# Patient Record
Sex: Male | Born: 2000 | Race: White | Hispanic: No | Marital: Single
Health system: Southern US, Community
[De-identification: ages and names within clinical notes are randomized; demographics above are authoritative.]

## PROBLEM LIST (undated history)

## (undated) DIAGNOSIS — S8982XA Other specified injuries of left lower leg, initial encounter: Secondary | ICD-10-CM

## (undated) HISTORY — PX: OTHER SURGICAL HISTORY: SHX169

---
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
## 2022-05-28 ENCOUNTER — Emergency Department
Admission: EM | Admit: 2022-05-28 | Discharge: 2022-05-28 | Disposition: A | Discharge status: Home / Self Care | Payer: BC Managed Care – PPO | Attending: Student in an Organized Health Care Education/Training Program | Admitting: Student in an Organized Health Care Education/Training Program

## 2022-05-28 ENCOUNTER — Other Ambulatory Visit: Payer: Self-pay

## 2022-05-28 DIAGNOSIS — R221 Localized swelling, mass and lump, neck: Secondary | ICD-10-CM

## 2022-05-28 DIAGNOSIS — R0981 Nasal congestion: Secondary | ICD-10-CM | POA: Diagnosis present

## 2022-05-28 DIAGNOSIS — J039 Acute tonsillitis, unspecified: Secondary | ICD-10-CM | POA: Diagnosis not present

## 2022-05-28 LAB — GROUP A STREP BY PCR: Group A Strep by PCR: NOT DETECTED

## 2022-05-28 MED ORDER — AMOXICILLIN 500 MG PO TABS: 500.0000 mg | ORAL_TABLET | Freq: Three times a day (TID) | ORAL | 0 refills | Status: AC

## 2022-05-28 MED ORDER — PREDNISONE 10 MG PO TABS: 50.0000 mg | ORAL_TABLET | Freq: Every day | ORAL | 0 refills | Status: AC

## 2022-05-28 MED ORDER — LIDOCAINE VISCOUS HCL 2 % MT SOLN: 15.0000 mL | OROMUCOSAL | 0 refills | Status: AC | PRN

## 2022-05-28 NOTE — ED Provider Notes (Signed)
[facility] Provider Note    Event Date/Time   First MD Initiated Contact with Patient 05/28/22 1329     (approximate)   History   Sore Throat   HPI  Nicholas Santana is a 22 y.o. male  with no significant past medical history  and as listed in EMR presents to the emergency department for treatment and evaluation of swelling to tonsils and sensation of enlarged uvula x 5 days along with chills.       Physical Exam   Triage Vital Signs: ED Triage Vitals  Enc Vitals Group     BP 05/28/22 1300 (!) 160/100     Pulse Rate 05/28/22 1300 89     Resp 05/28/22 1300 16     Temp 05/28/22 1300 97.8 F (36.6 C)     Temp src --      SpO2 05/28/22 1300 95 %     Weight 05/28/22 1301 235 lb (106.6 kg)     Height 05/28/22 1301 6\' 1"  (1.854 m)     Head Circumference --      Peak Flow --      Pain Score 05/28/22 1311 1     Pain Loc --      Pain Edu? --      Excl. in GC? --     Most recent vital signs: Vitals:   05/28/22 1300  BP: (!) 160/100  Pulse: 89  Resp: 16  Temp: 97.8 F (36.6 C)  SpO2: 95%    General: Awake, no distress.  CV:  Good peripheral perfusion.  Resp:  Normal effort.  Abd:  No distention.  Other:  Tonsils grade II with exudate. Uvula is midline and erythematous. Tolerating secretions. Voice is clear. Palpable lymph nodes in anterior cervical chain.    ED Results / Procedures / Treatments   Labs (all labs ordered are listed, but only abnormal results are displayed) Labs Reviewed  GROUP A STREP BY PCR     EKG  Not indicated.   RADIOLOGY  Image and radiology report reviewed and interpreted by me. Radiology report consistent with the same.  Not indicated.  PROCEDURES:  Critical Care performed: No  Procedures   MEDICATIONS ORDERED IN ED:  Medications - No data to display   IMPRESSION / MDM / ASSESSMENT AND PLAN / ED COURSE   I have reviewed the triage note.  Differential diagnosis includes, but is not  limited to strep pharyngitis, uvulitis, viral pharyngitis, mono  Patient's presentation is most consistent with acute illness / injury with system symptoms.  22 year old male presenting to the emergency department for treatment and evaluation of enlarged tonsils and sensation of swollen uvula for the past 5 days.  See HPI for further details.  Patient is concerned that he may have epiglottitis.  He is 22 years of age, relaxed while sitting or lying on his back, has a clear voice, airway is patent, no respiratory distress, tolerating secretions, afebrile, not tachycardic, not tachypneic, normal SpO2 at 95% on room air therefore I have little to no suspicion.  This was discussed with the patient and his father.  Tonsils are swollen with exudate and uvula is red, but not significantly edematous. Anterior cervical nodes are palpable without reported tenderness. Strep screen is pending. Plan will be to treat with amoxicillin, prednisone, and viscous lidocaine. Patient also inquiring about tonsillectomy.  He was advised he will need to discuss this with the otolaryngologist.       FINAL CLINICAL  IMPRESSION(S) / ED DIAGNOSES   Final diagnoses:  Tonsillitis  Swollen uvula     Rx / DC Orders   ED Discharge Orders          Ordered    amoxicillin (AMOXIL) 500 MG tablet  3 times daily        05/28/22 1339    predniSONE (DELTASONE) 10 MG tablet  Daily        05/28/22 1339    lidocaine (XYLOCAINE) 2 % solution  Every 4 hours PRN        05/28/22 1339             Note:  This document was prepared using Dragon voice recognition software and may include unintentional dictation errors.   Chinita Pester, FNP 05/28/22 1412    Willy Eddy, MD 05/28/22 1539

## 2022-05-28 NOTE — ED Triage Notes (Signed)
Pt to ED for swelling to tonsils for 5 days. Reports sinus congestion and chills.
